# Patient Record
Sex: Female | Born: 2000
Health system: Southern US, Community
[De-identification: ages and names within clinical notes are randomized; demographics above are authoritative.]

## PROBLEM LIST (undated history)

## (undated) DIAGNOSIS — L502 Urticaria due to cold and heat: Secondary | ICD-10-CM

## (undated) DIAGNOSIS — H6690 Otitis media, unspecified, unspecified ear: Secondary | ICD-10-CM

## (undated) HISTORY — DX: Otitis media, unspecified, unspecified ear: H66.90

## (undated) HISTORY — PX: TYMPANOSTOMY TUBE PLACEMENT: SHX32

---
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
## 2020-06-07 ENCOUNTER — Other Ambulatory Visit: Payer: Self-pay

## 2020-06-07 ENCOUNTER — Other Ambulatory Visit: Payer: Self-pay | Admitting: Family

## 2020-06-07 ENCOUNTER — Other Ambulatory Visit (INDEPENDENT_AMBULATORY_CARE_PROVIDER_SITE_OTHER): Payer: BC Managed Care – PPO

## 2020-06-07 DIAGNOSIS — F5000 Anorexia nervosa, unspecified: Secondary | ICD-10-CM

## 2020-06-07 NOTE — Addendum Note (Signed)
Addended by: Alycia Patten on: 06/07/2020 01:54 PM   Modules accepted: Orders

## 2020-06-10 LAB — COMPREHENSIVE METABOLIC PANEL
AG Ratio: 2.1 (calc) (ref 1.0–2.5)
ALT: 20 U/L (ref 5–32)
AST: 21 U/L (ref 12–32)
Albumin: 4.8 g/dL (ref 3.6–5.1)
Alkaline phosphatase (APISO): 39 U/L (ref 36–128)
BUN: 16 mg/dL (ref 7–20)
CO2: 27 mmol/L (ref 20–32)
Calcium: 9.6 mg/dL (ref 8.9–10.4)
Chloride: 103 mmol/L (ref 98–110)
Creat: 0.94 mg/dL (ref 0.50–1.00)
Globulin: 2.3 g/dL (calc) (ref 2.0–3.8)
Glucose, Bld: 78 mg/dL (ref 65–99)
Potassium: 4.3 mmol/L (ref 3.8–5.1)
Sodium: 139 mmol/L (ref 135–146)
Total Bilirubin: 0.4 mg/dL (ref 0.2–1.1)
Total Protein: 7.1 g/dL (ref 6.3–8.2)

## 2020-06-10 LAB — CBC WITH DIFFERENTIAL/PLATELET
Absolute Monocytes: 384 cells/uL (ref 200–950)
Basophils Absolute: 72 cells/uL (ref 0–200)
Basophils Relative: 1.2 %
Eosinophils Absolute: 138 cells/uL (ref 15–500)
Eosinophils Relative: 2.3 %
HCT: 40.7 % (ref 35.0–45.0)
Hemoglobin: 13.3 g/dL (ref 11.7–15.5)
Lymphs Abs: 2196 cells/uL (ref 850–3900)
MCH: 31 pg (ref 27.0–33.0)
MCHC: 32.7 g/dL (ref 32.0–36.0)
MCV: 94.9 fL (ref 80.0–100.0)
MPV: 11.8 fL (ref 7.5–12.5)
Monocytes Relative: 6.4 %
Neutro Abs: 3210 cells/uL (ref 1500–7800)
Neutrophils Relative %: 53.5 %
Platelets: 249 10*3/uL (ref 140–400)
RBC: 4.29 10*6/uL (ref 3.80–5.10)
RDW: 11.8 % (ref 11.0–15.0)
Total Lymphocyte: 36.6 %
WBC: 6 10*3/uL (ref 3.8–10.8)

## 2020-06-10 LAB — THYROID PANEL WITH TSH
Free Thyroxine Index: 1.8 (ref 1.4–3.8)
T3 Uptake: 28 % (ref 22–35)
T4, Total: 6.3 ug/dL (ref 5.3–11.7)
TSH: 0.98 mIU/L

## 2020-06-10 LAB — AMYLASE: Amylase: 29 U/L (ref 21–101)

## 2020-06-10 LAB — LIPASE: Lipase: 33 U/L (ref 7–60)

## 2020-06-10 LAB — FERRITIN: Ferritin: 22 ng/mL (ref 16–154)

## 2020-06-10 LAB — MAGNESIUM: Magnesium: 1.7 mg/dL (ref 1.5–2.5)

## 2020-06-10 LAB — PHOSPHORUS: Phosphorus: 3.9 mg/dL (ref 2.7–5.0)

## 2020-06-10 LAB — VITAMIN D 25 HYDROXY (VIT D DEFICIENCY, FRACTURES): Vit D, 25-Hydroxy: 30 ng/mL (ref 30–100)

## 2020-06-10 LAB — SEDIMENTATION RATE

## 2020-06-27 NOTE — Progress Notes (Signed)
Patient came in for labs. Labs ordered by Bernell List. Successful collection.

## 2020-07-03 ENCOUNTER — Other Ambulatory Visit: Payer: Self-pay

## 2020-07-03 ENCOUNTER — Ambulatory Visit (INDEPENDENT_AMBULATORY_CARE_PROVIDER_SITE_OTHER): Payer: BC Managed Care – PPO | Admitting: Pediatrics

## 2020-07-03 VITALS — BP 106/79 | HR 69 | Ht 67.4 in | Wt 111.8 lb

## 2020-07-03 DIAGNOSIS — Z1389 Encounter for screening for other disorder: Secondary | ICD-10-CM

## 2020-07-03 NOTE — Progress Notes (Signed)
Reviewed weight and vitals from today's visit. Weight is down slightly from scales at [facility], but different scales may yield different numbers. Vitals are stable.   Alfonso Ramus, FNP

## 2020-07-04 ENCOUNTER — Ambulatory Visit: Payer: BC Managed Care – PPO | Admitting: Pediatrics
